# Patient Record
Sex: Female | Born: 2005 | Race: White | Hispanic: No | Marital: Single | Smoking: Never smoker
Health system: Southern US, Community
[De-identification: ages and names within clinical notes are randomized; demographics above are authoritative.]

## PROBLEM LIST (undated history)

## (undated) DIAGNOSIS — J45909 Unspecified asthma, uncomplicated: Secondary | ICD-10-CM

---
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
## 2024-04-26 ENCOUNTER — Other Ambulatory Visit: Payer: Self-pay

## 2024-04-26 ENCOUNTER — Emergency Department: Admission: EM | Admit: 2024-04-26 | Discharge: 2024-04-26 | Disposition: A | Discharge status: Home / Self Care

## 2024-04-26 DIAGNOSIS — W268XXA Contact with other sharp object(s), not elsewhere classified, initial encounter: Secondary | ICD-10-CM | POA: Insufficient documentation

## 2024-04-26 DIAGNOSIS — S61012A Laceration without foreign body of left thumb without damage to nail, initial encounter: Secondary | ICD-10-CM | POA: Insufficient documentation

## 2024-04-26 DIAGNOSIS — S6992XA Unspecified injury of left wrist, hand and finger(s), initial encounter: Secondary | ICD-10-CM | POA: Diagnosis present

## 2024-04-26 DIAGNOSIS — Z23 Encounter for immunization: Secondary | ICD-10-CM | POA: Diagnosis not present

## 2024-04-26 MED ORDER — LIDOCAINE-EPINEPHRINE-TETRACAINE (LET) TOPICAL GEL
3.0000 mL | Freq: Once | TOPICAL | Status: AC
  Administered 2024-04-26: 3 mL via TOPICAL
  Filled 2024-04-26: qty 3

## 2024-04-26 MED ORDER — TETANUS-DIPHTH-ACELL PERTUSSIS 5-2.5-18.5 LF-MCG/0.5 IM SUSY
0.5000 mL | PREFILLED_SYRINGE | Freq: Once | INTRAMUSCULAR | Status: AC
  Administered 2024-04-26: 0.5 mL via INTRAMUSCULAR
  Filled 2024-04-26: qty 0.5

## 2024-04-26 MED ORDER — LIDOCAINE HCL (PF) 1 % IJ SOLN
5.0000 mL | Freq: Once | INTRAMUSCULAR | Status: DC
  Filled 2024-04-26: qty 5

## 2024-04-26 NOTE — ED Triage Notes (Signed)
 Patient states she cut her left hand using a painter cutter; bleeding controlled at time of triage.

## 2024-04-26 NOTE — ED Provider Notes (Signed)
 [facility] Emergency Department Provider Note     Event Date/Time   First MD Initiated Contact with Patient 04/26/24 1128     (approximate)   History   Laceration   HPI  Felicia Fisher is a 18 y.o. female with no significant past medical history presents to the ED for evaluation of a laceration to her left hand.  Patient reports she was using a paint cutter and accidentally sliced her hand.  Bleeding is controlled.  Patient denies decrease sensation and motor function.  Patient is not up-to-date on her tetanus.     Physical Exam   Triage Vital Signs: ED Triage Vitals  Encounter Vitals Group     BP 04/26/24 1125 (!) 129/90     Girls Systolic BP Percentile --      Girls Diastolic BP Percentile --      Boys Systolic BP Percentile --      Boys Diastolic BP Percentile --      Pulse Rate 04/26/24 1125 98     Resp 04/26/24 1125 18     Temp 04/26/24 1125 97.8 F (36.6 C)     Temp Source 04/26/24 1125 Oral     SpO2 04/26/24 1125 100 %     Weight 04/26/24 1126 130 lb (59 kg)     Height 04/26/24 1126 5' 6 (1.676 m)     Head Circumference --      Peak Flow --      Pain Score 04/26/24 1125 6     Pain Loc --      Pain Education --      Exclude from Growth Chart --     Most recent vital signs: Vitals:   04/26/24 1125  BP: (!) 129/90  Pulse: 98  Resp: 18  Temp: 97.8 F (36.6 C)  SpO2: 100%    General Awake, no distress.  HEENT NCAT.  CV:  Good peripheral perfusion.  RESP:  Normal effort.  ABD:  No distention.  Other:  2 cm laceration to left lateral thenar eminence.  Full range of motion of all digits.  Patient is able to bite her hand and fist.  Sensation is intact.  Good and brisk capillary refills.  Radial pulse 2+.    ED Results / Procedures / Treatments   Labs (all labs ordered are listed, but only abnormal results are displayed) Labs Reviewed - No data to display  No results found.  PROCEDURES:  Critical Care  performed: No  .Laceration Repair  Date/Time: 04/26/2024 1:08 PM  Performed by: Margrette Monte A, PA-C Authorized by: Margrette Monte A, PA-C   Consent:    Consent obtained:  Verbal   Consent given by:  Patient   Risks discussed:  Infection, pain, poor cosmetic result, poor wound healing and need for additional repair Anesthesia:    Anesthesia method:  Topical application   Topical anesthetic:  LET Laceration details:    Location:  Hand   Hand location:  L palm   Length (cm):  2   Depth (mm):  1 Exploration:    Hemostasis achieved with:  LET Treatment:    Area cleansed with:  Povidone-iodine and saline   Amount of cleaning:  Standard   Irrigation solution:  Sterile saline   Irrigation method:  Syringe Skin repair:    Repair method:  Sutures and Steri-Strips   Suture size:  5-0   Suture material:  Nylon   Suture technique:  Running   Number  of sutures:  1   Number of Steri-Strips:  2 Approximation:    Approximation:  Close Repair type:    Repair type:  Simple Post-procedure details:    Dressing:  Non-adherent dressing   Procedure completion:  Tolerated    MEDICATIONS ORDERED IN ED: Medications  lidocaine  (PF) (XYLOCAINE ) 1 % injection 5 mL (has no administration in time range)  Tdap (BOOSTRIX) injection 0.5 mL (0.5 mLs Intramuscular Given 04/26/24 1209)  lidocaine -EPINEPHrine -tetracaine  (LET) topical gel (3 mLs Topical Given 04/26/24 1210)   IMPRESSION / MDM / ASSESSMENT AND PLAN / ED COURSE  I reviewed the triage vital signs and the nursing notes.                               18 y.o. female presents to the emergency department for evaluation and treatment of laceration to left hand. See HPI for further details.   Patient's presentation is most consistent with acute, uncomplicated illness.  Patient is alert and oriented.  She is hemodynamically stable.  Please see procedure note for laceration repair. Continuous running stitch placed. Patient tolerated well.   Nonadhesive bandage placed.  Advised to return to ED for suture removal in 7 to 10 days.  Patient verbalized understanding.  She is in stable condition for discharge home.  ED return precaution discussed.  FINAL CLINICAL IMPRESSION(S) / ED DIAGNOSES   Final diagnoses:  Laceration of left thumb without foreign body without damage to nail, initial encounter    Rx / DC Orders   ED Discharge Orders     None       Note:  This document was prepared using Dragon voice recognition software and may include unintentional dictation errors.    Margrette Monte A, PA-C 04/26/24 1310    Clarine Ozell LABOR, MD 04/26/24 2010

## 2024-04-26 NOTE — Discharge Instructions (Signed)
 Return to ED or follow-up with your primary care provider for suture removal in 7 to 10 days.  Keep hand elevated to help with swelling. No fishing, ATV riding or activities at the creek until suture removal :)   Keep sutures dry for first 24 hrs. Keep suture site of clean & dry. Gently use soap & water after first 24 hrs. DO NOT USE alcohol, hydrogen peroxide etc, to clean skin. You may cover the incision with clean gauze & replace it after your daily shower for your comfort. If you have skin tapes (Steristrips) or skin glue (Dermabond) on your incision, leave them in place. They will fall off on their own like a scab in a few weeks.  You may trim any edges that curl up with clean scissors.   Pain control:  Ibuprofen (motrin/aleve/advil) - You can take 3 tablets (600 mg) every 6 hours as needed for pain/fever.  Acetaminophen  (tylenol ) - You can take 2 extra strength tablets (1000 mg) every 6 hours as needed for pain/fever.  You can alternate these medications or take them together.  Make sure you eat food/drink water when taking these medications.
